# Patient Record
Sex: Female | Born: 1960 | Race: White | Hispanic: No | Marital: Married | Smoking: Current every day smoker
Health system: Southern US, Community
[De-identification: ages and names within clinical notes are randomized; demographics above are authoritative.]

## PROBLEM LIST (undated history)

## (undated) DIAGNOSIS — F329 Major depressive disorder, single episode, unspecified: Secondary | ICD-10-CM

## (undated) DIAGNOSIS — E785 Hyperlipidemia, unspecified: Secondary | ICD-10-CM

## (undated) DIAGNOSIS — F419 Anxiety disorder, unspecified: Secondary | ICD-10-CM

## (undated) DIAGNOSIS — T7840XA Allergy, unspecified, initial encounter: Secondary | ICD-10-CM

## (undated) DIAGNOSIS — F32A Depression, unspecified: Secondary | ICD-10-CM

## (undated) DIAGNOSIS — I10 Essential (primary) hypertension: Secondary | ICD-10-CM

## (undated) HISTORY — DX: Essential (primary) hypertension: I10

## (undated) HISTORY — DX: Anxiety disorder, unspecified: F41.9

## (undated) HISTORY — DX: Depression, unspecified: F32.A

## (undated) HISTORY — DX: Major depressive disorder, single episode, unspecified: F32.9

## (undated) HISTORY — DX: Allergy, unspecified, initial encounter: T78.40XA

## (undated) HISTORY — DX: Hyperlipidemia, unspecified: E78.5

---

## 2014-08-23 ENCOUNTER — Encounter: Payer: Self-pay | Admitting: *Deleted

## 2014-10-15 ENCOUNTER — Encounter: Payer: Self-pay | Admitting: Family Medicine

## 2014-10-15 ENCOUNTER — Ambulatory Visit (INDEPENDENT_AMBULATORY_CARE_PROVIDER_SITE_OTHER): Payer: BLUE CROSS/BLUE SHIELD | Admitting: Family Medicine

## 2014-10-15 VITALS — BP 118/74 | HR 94 | Temp 98.6°F | Resp 18 | Ht 68.5 in | Wt 256.0 lb

## 2014-10-15 DIAGNOSIS — F329 Major depressive disorder, single episode, unspecified: Secondary | ICD-10-CM

## 2014-10-15 DIAGNOSIS — D485 Neoplasm of uncertain behavior of skin: Secondary | ICD-10-CM

## 2014-10-15 DIAGNOSIS — F32A Depression, unspecified: Secondary | ICD-10-CM

## 2014-10-15 MED ORDER — METOPROLOL SUCCINATE ER 100 MG PO TB24: 100.0000 mg | ORAL_TABLET | Freq: Every day | ORAL | Status: DC

## 2014-10-15 MED ORDER — BUPROPION HCL ER (XL) 150 MG PO TB24: 300.0000 mg | ORAL_TABLET | Freq: Every day | ORAL | Status: DC

## 2014-10-15 MED ORDER — DULOXETINE HCL 60 MG PO CPEP: 60.0000 mg | ORAL_CAPSULE | Freq: Every day | ORAL | Status: DC

## 2014-10-15 NOTE — Progress Notes (Signed)
Subjective:    Patient ID: Megan Higgins, female    DOB: [birth year], 54 y.o.   MRN: [id number]  HPI (Date to establish care. She has a spot on her left breast that has been there since November. On examination there is a 4 cm x 4 cm large telangiectasia on her left breast at approximately 5:00 below the nipple. There is also a 1 cm x 1 cm ulcer in the center of this is very superficial. There is no palpable mass within the breast. There is no drainage expressed from the central ulcer. Patient stated it began in November after her dog stepped on her chest. She is also having worsening depression and anxiety. She complains of anxiety on a daily basis. She is also complaining of depression and anhedonia trouble sleeping poor concentration and weight gain. She denies suicidal ideation but she extremely tearful today on exam. She also complains of memory loss which she associates with the Crestor that she recently began. She also complains of myalgias on Crestor. Past Medical History  Diagnosis Date  . Allergy   . Hypertension   . Hyperlipidemia   . Depression   . Anxiety    No past surgical history on file. Current Outpatient Prescriptions on File Prior to Visit  Medication Sig Dispense Refill  . rosuvastatin (CRESTOR) 10 MG tablet Take 10 mg by mouth daily.     No current facility-administered medications on file prior to visit.   Allergies  Allergen Reactions  . Penicillins    History   Social History  . Marital Status: Married    Spouse Name: N/A    Number of Children: N/A  . Years of Education: N/A   Occupational History  . Not on file.   Social History Main Topics  . Smoking status: Current Every Day Smoker -- 1.00 packs/day    Types: Cigarettes  . Smokeless tobacco: Never Used  . Alcohol Use: No  . Drug Use: No  . Sexual Activity: Yes   Other Topics Concern  . Not on file   Social History Narrative   No family history on file.    Review of Systems  All  other systems reviewed and are negative.      Objective:   Physical Exam  Cardiovascular: Normal rate, regular rhythm, normal heart sounds and intact distal pulses.   No murmur heard. Pulmonary/Chest: Effort normal and breath sounds normal. No respiratory distress. She has no wheezes. She has no rales. She exhibits no tenderness.  Abdominal: Soft. Bowel sounds are normal. She exhibits no distension. There is no tenderness. There is no rebound and no guarding.  Skin: There is erythema.  Vitals reviewed.  please see the description of the lesion in the history present illness        Assessment & Plan:  Neoplasm of uncertain behavior of skin - Plan: Pathology  Depression  I'm concerned about possible Paget's disease of the breast. Using sterile technique, I anesthetized the lesion was 0.1% lidocaine with epinephrine. I then performed a shave biopsy of the central ulcer approximately 1 cm x 1 cm. This was sent to pathology and labeled container. Hemostasis was achieved with Drysol and a Band-Aid and pressure. I will also schedule the patient for mammogram. I will supplement and augment her Cymbalta with Wellbutrin XL 150 mg by mouth every morning. We will increase to 300 mg by mouth every morning in one month if no better. I would like to see the patient back in 3  months to check fasting lab work. In the meantime the patient will temporarily discontinue Crestor to see if the memory loss improves.

## 2014-10-17 ENCOUNTER — Telehealth: Payer: Self-pay | Admitting: *Deleted

## 2014-10-17 LAB — PATHOLOGY

## 2014-10-17 NOTE — Telephone Encounter (Signed)
Pt has appointment at [facility] on Feb 16 she is to arrive at 10:30 for a 10:45am appt, I left message for pt to return my call for appt date time and location, pending call back

## 2014-10-19 NOTE — Telephone Encounter (Signed)
Pt called back and is aware of her Mammogram appointment

## 2014-10-30 ENCOUNTER — Ambulatory Visit (HOSPITAL_COMMUNITY)
Admission: RE | Admit: 2014-10-30 | Discharge: 2014-10-30 | Disposition: A | Discharge status: Home / Self Care | Payer: BLUE CROSS/BLUE SHIELD | Source: Ambulatory Visit | Attending: Family Medicine | Admitting: Family Medicine

## 2014-10-30 ENCOUNTER — Other Ambulatory Visit: Payer: Self-pay | Admitting: Family Medicine

## 2014-10-30 DIAGNOSIS — D499 Neoplasm of unspecified behavior of unspecified site: Secondary | ICD-10-CM

## 2014-10-30 DIAGNOSIS — D485 Neoplasm of uncertain behavior of skin: Secondary | ICD-10-CM | POA: Insufficient documentation

## 2014-10-31 ENCOUNTER — Telehealth: Payer: Self-pay | Admitting: Family Medicine

## 2014-10-31 MED ORDER — CEPHALEXIN 500 MG PO CAPS: 500.0000 mg | ORAL_CAPSULE | Freq: Three times a day (TID) | ORAL | Status: DC

## 2014-10-31 NOTE — Telephone Encounter (Signed)
Pt aware, med sent to pharm and appt made for a 1 week follow up after antibx

## 2014-10-31 NOTE — Telephone Encounter (Signed)
Patient is calling to say that she went to get mammogram and they told her she needed an antibiotic for a biopsy she is going to have  [phone]

## 2014-10-31 NOTE — Telephone Encounter (Signed)
LMTRC

## 2014-10-31 NOTE — Telephone Encounter (Signed)
Per Dr. Dennard Schaumann pt should be on Keflex 500mg  TID x 7 days and follow up in 1 week after antibx

## 2014-11-15 ENCOUNTER — Encounter: Payer: Self-pay | Admitting: Family Medicine

## 2014-11-15 ENCOUNTER — Ambulatory Visit (INDEPENDENT_AMBULATORY_CARE_PROVIDER_SITE_OTHER): Payer: BLUE CROSS/BLUE SHIELD | Admitting: Family Medicine

## 2014-11-15 VITALS — BP 146/90 | HR 92 | Temp 98.1°F | Resp 18 | Ht 68.5 in | Wt 257.0 lb

## 2014-11-15 DIAGNOSIS — D485 Neoplasm of uncertain behavior of skin: Secondary | ICD-10-CM

## 2014-11-15 NOTE — Progress Notes (Signed)
Subjective:    Patient ID: Megan Higgins, female    DOB: 1960-12-03, 54 y.o.   MRN: [id number]  HPI 10/15/14 Here today to establish care. She has a spot on her left breast that has been there since November. On examination there is a 4 cm x 4 cm large telangiectasia on her left breast at approximately 5:00 below the nipple. There is also a 1 cm x 1 cm ulcer in the center of this is very superficial. There is no palpable mass within the breast. There is no drainage expressed from the central ulcer. Patient stated it began in November after her dog stepped on her chest. She is also having worsening depression and anxiety. She complains of anxiety on a daily basis. She is also complaining of depression and anhedonia trouble sleeping poor concentration and weight gain. She denies suicidal ideation but she extremely tearful today on exam. She also complains of memory loss which she associates with the Crestor that she recently began. She also complains of myalgias on Crestor.  At that time, my plan was:  I'm concerned about possible Paget's disease of the breast. Using sterile technique, I anesthetized the lesion was 0.1% lidocaine with epinephrine. I then performed a shave biopsy of the central ulcer approximately 1 cm x 1 cm. This was sent to pathology and labeled container. Hemostasis was achieved with Drysol and a Band-Aid and pressure. I will also schedule the patient for mammogram. I will supplement and augment her Cymbalta with Wellbutrin XL 150 mg by mouth every morning. We will increase to 300 mg by mouth every morning in one month if no better. I would like to see the patient back in 3 months to check fasting lab work. In the meantime the patient will temporarily discontinue Crestor to see if the memory loss improves.  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  Mammogram and [place] of  breast were negative.  We have tried keflex for possible cellulitis in the interval after the radiologist called and reported possible cellulitis, she is here for recheck today.  The lesion does not look appreciably different. There is an area of erythema and telangiectasia in the exact same spot on the left breast which is 4 cm x 3.5 cm. There is a 1 cm x 1 cm central ulcer which is very superficial. This is essentially unchanged from her previous exam. It does not seem to be improved with time or antibiotics. It also does not appear to be a pyogenic granuloma on my exam. There is no papular area of increased blood vessel growth.  Patient feels like she is doing better since we added Wellbutrin with regards to the depression.  Although not completely better she does not want to increase it at the present time. She would also like to defer her fasting blood work until May  Past Medical History  Diagnosis Date  . Allergy   . Hypertension   . Hyperlipidemia   . Depression   . Anxiety    No past surgical history on file. Current Outpatient Prescriptions on File Prior to Visit  Medication Sig Dispense Refill  . buPROPion (WELLBUTRIN XL) 150 MG 24 hr tablet Take 2 tablets (300 mg total) by mouth daily. 60 tablet 3  . cephALEXin (KEFLEX) 500 MG capsule Take 1 capsule (500 mg total) by mouth 3 (three) times daily. X 7 days 21 capsule 0  . cetirizine (  ZYRTEC) 10 MG tablet Take 10 mg by mouth daily.    . DULoxetine (CYMBALTA) 60 MG capsule Take 1 capsule (60 mg total) by mouth daily. 30 capsule 5  . metoprolol succinate (TOPROL-XL) 100 MG 24 hr tablet Take 1 tablet (100 mg total) by mouth daily. Take with or immediately following a meal. 30 tablet 5  . rosuvastatin (CRESTOR) 10 MG tablet Take 10 mg by mouth daily.     No current facility-administered medications on file prior to visit.   Allergies  Allergen Reactions  . Penicillins    History   Social History  . Marital Status: Married    Spouse  Name: N/A  . Number of Children: N/A  . Years of Education: N/A   Occupational History  . Not on file.   Social History Main Topics  . Smoking status: Current Every Day Smoker -- 1.00 packs/day    Types: Cigarettes  . Smokeless tobacco: Never Used  . Alcohol Use: No  . Drug Use: No  . Sexual Activity: Yes   Other Topics Concern  . Not on file   Social History Narrative   No family history on file.    Review of Systems  All other systems reviewed and are negative.      Objective:   Physical Exam  Cardiovascular: Normal rate, regular rhythm, normal heart sounds and intact distal pulses.   No murmur heard. Pulmonary/Chest: Effort normal and breath sounds normal. No respiratory distress. She has no wheezes. She has no rales. She exhibits no tenderness.  Abdominal: Soft. Bowel sounds are normal. She exhibits no distension. There is no tenderness. There is no rebound and no guarding.  Skin: There is erythema.  Vitals reviewed.  please see the description of the lesion in the history present illness        Assessment & Plan:  Neoplasm of uncertain behavior of skin - Plan: Ambulatory referral to Dermatology  breast cancer has been ruled out. The lesion doesn't bleed excessively and is extremely tender to palpation. However my clinical impression is that it is not a pyogenic granuloma. I do feel it is a nonhealing healing wound. I would like to get a second opinion from dermatology. It may require complete excisional biopsy but I would like to obtain a second opinion first. I've also asked the patient to return fasting in May for a CMP, fasting lipid panel, and a CBC. We can increase Wellbutrin at that time if she does not continue to improve. I will also recheck her blood pressure at that time and if still elevated, would add Diovan.

## 2015-03-08 ENCOUNTER — Other Ambulatory Visit: Payer: Self-pay | Admitting: Family Medicine

## 2015-04-19 ENCOUNTER — Other Ambulatory Visit: Payer: Self-pay | Admitting: Family Medicine

## 2015-04-20 ENCOUNTER — Other Ambulatory Visit: Payer: Self-pay | Admitting: Family Medicine

## 2015-04-22 NOTE — Telephone Encounter (Signed)
Refill appropriate and filled per protocol. 

## 2015-10-05 ENCOUNTER — Other Ambulatory Visit: Payer: Self-pay | Admitting: Family Medicine

## 2015-12-03 ENCOUNTER — Telehealth: Payer: Self-pay | Admitting: Family Medicine

## 2015-12-03 NOTE — Telephone Encounter (Signed)
Pt called and states that Megan Higgins told her that Dr. Dennard Higgins flagged her prescription request for Bupropion XL 150mg  and she needs an OV.  She is very confused and would like an explanation. [phone]

## 2015-12-04 NOTE — Telephone Encounter (Signed)
Called and spoke to pt and explained to her that we have not sent any of her medications to mail order that the last placed we sent her meds to was [facility]. She is having some issues with her ins and where to send for refills. She is going to call them back and have them refax an ok for her prescriptions. Told pt to tell them to put my name on it so that it will get put in my hands as I am afraid that [facility] is possibly sending to the wrong MD office.

## 2015-12-06 MED ORDER — BUPROPION HCL ER (XL) 150 MG PO TB24: 300.0000 mg | ORAL_TABLET | Freq: Every day | ORAL | Status: DC

## 2015-12-06 MED ORDER — METOPROLOL SUCCINATE ER 100 MG PO TB24: ORAL_TABLET | ORAL | Status: DC

## 2015-12-06 MED ORDER — DULOXETINE HCL 60 MG PO CPEP: ORAL_CAPSULE | ORAL | Status: DC

## 2015-12-06 NOTE — Telephone Encounter (Signed)
[facility] faxes from [facility] and meds refilled

## 2016-04-15 IMAGING — MG MM DIGITAL DIAGNOSTIC BILAT
5 series · 5 of 5 positions shown · non-contrast
Comparison: None.

CLINICAL DATA: 54-year-old female with a nonhealing painful left
breast wound. The patient initially had an erythematous area of
concern several months ago after her breast had been stepped on by
her dog. The patient went to see her primary physician who
surgically excised a small piece of area with pathology returning
benign (pyogenic granuloma), however the patient now reports a
nonhealing in painful wound in this location.

EXAM:
DIGITAL DIAGNOSTIC BILATERAL MAMMOGRAM WITH CAD
ULTRASOUND LEFT BREAST

[L CC]
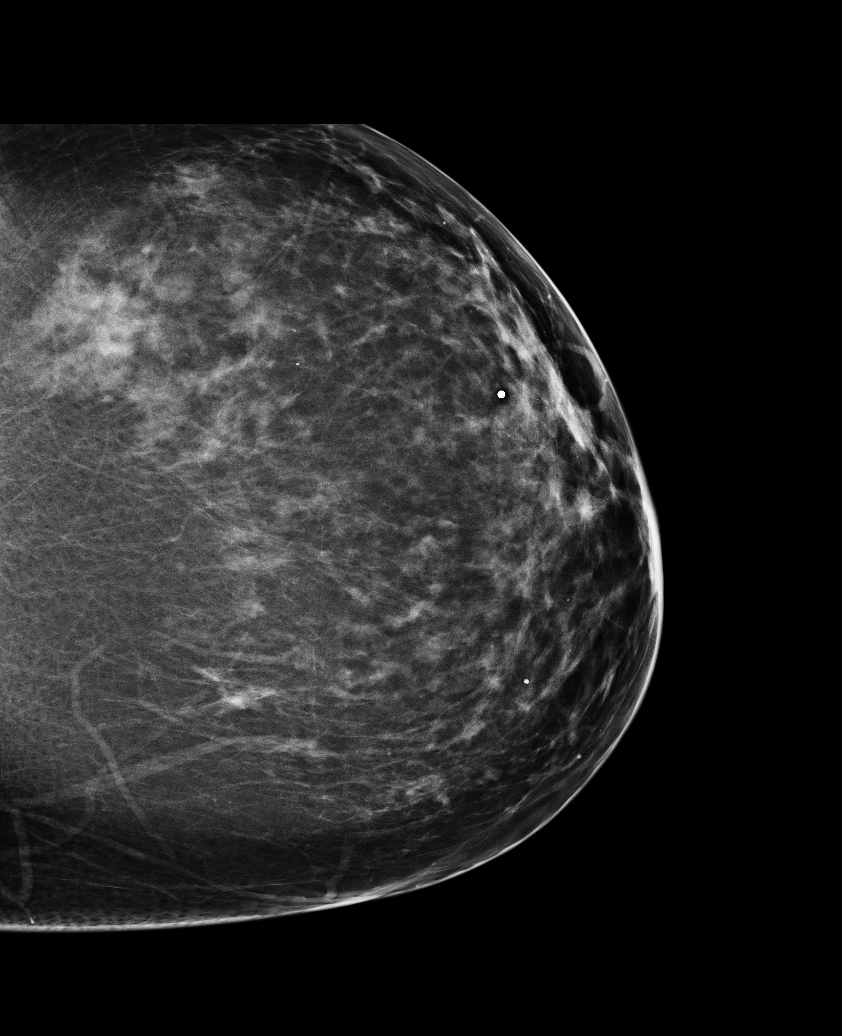

[L MLO]
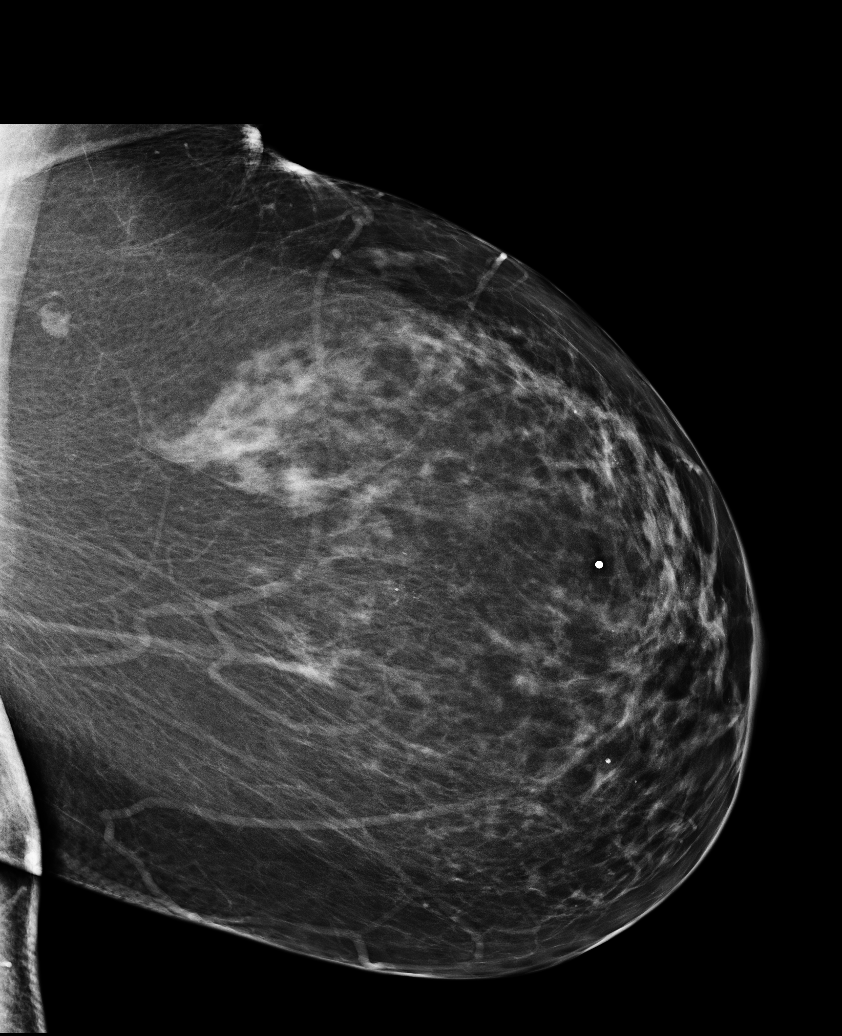

[R CC]
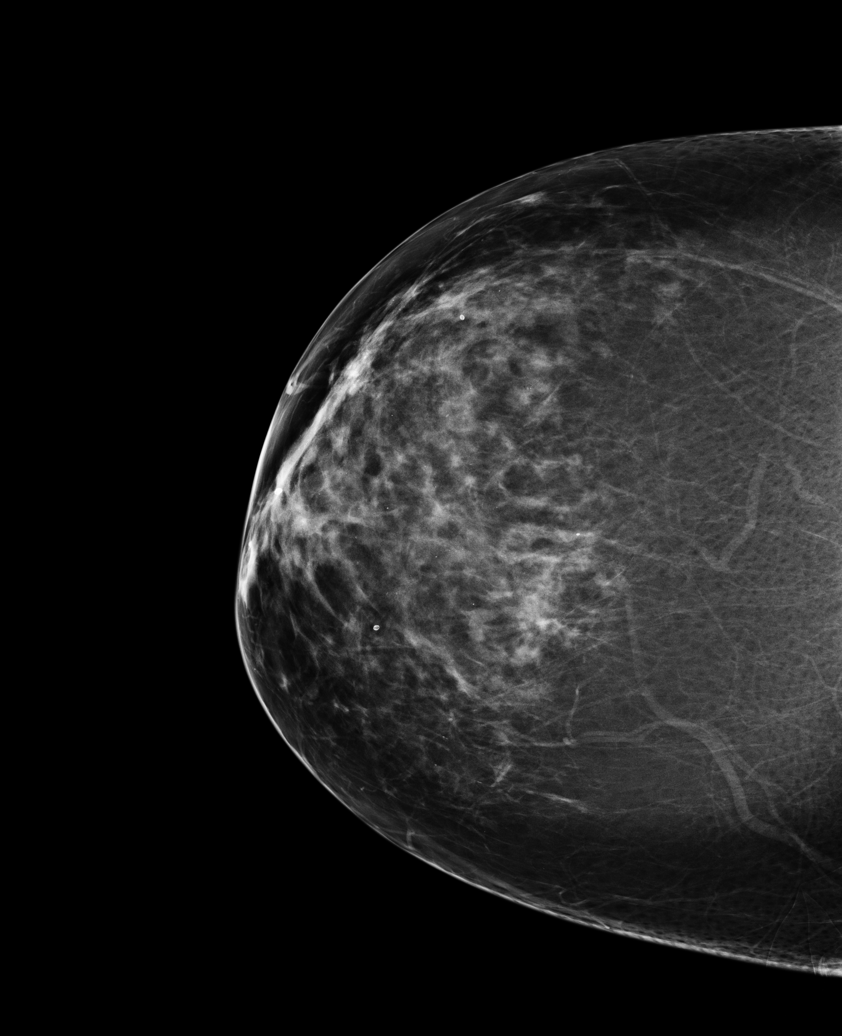

[R MLO]
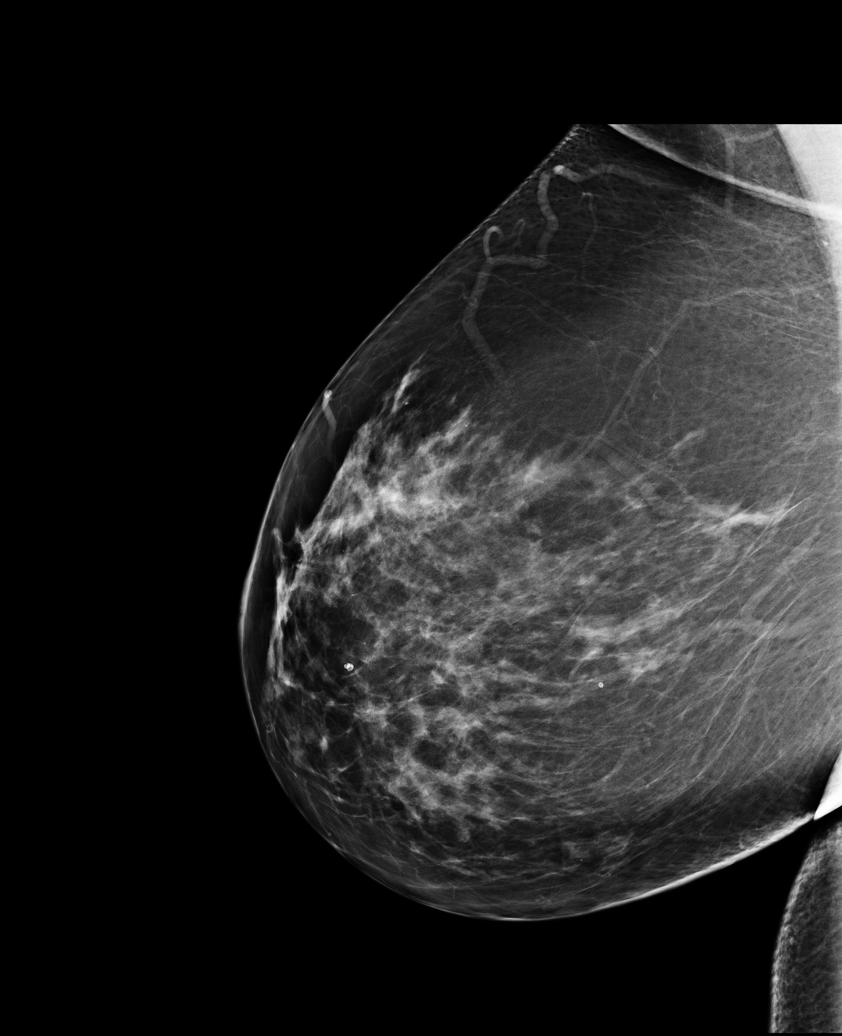

[L TAN]
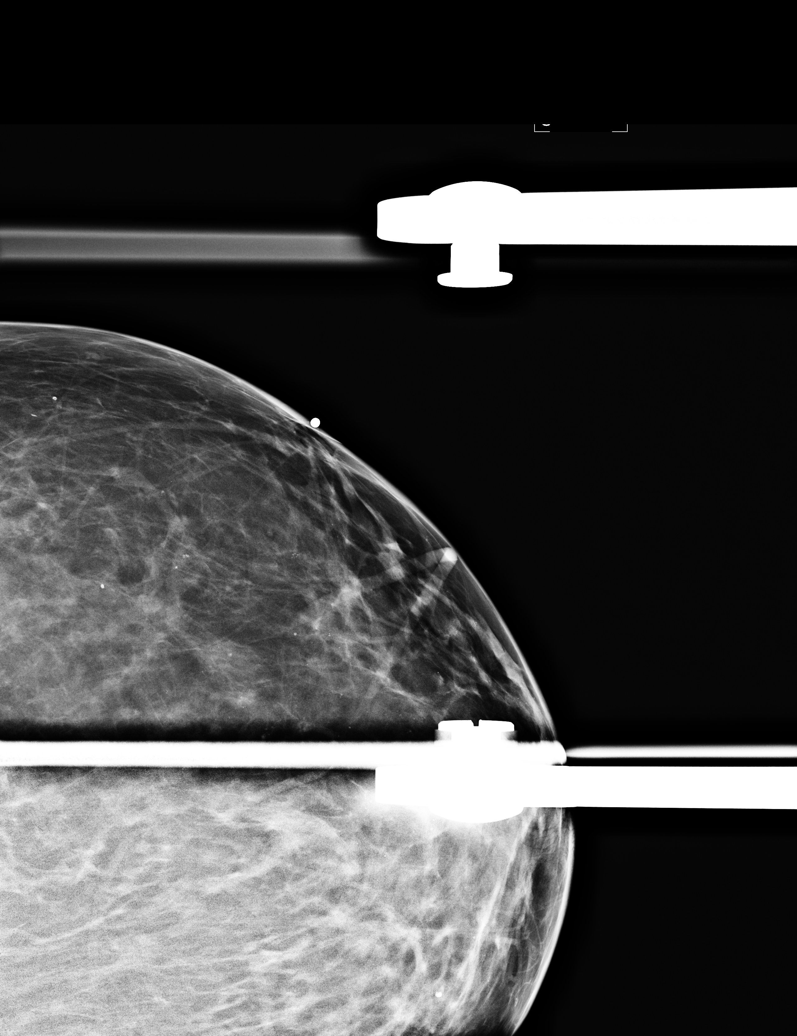

[5 of 5 positions shown; findings below may reference images not displayed]

ACR Breast Density Category c: The breast tissue is heterogeneously
dense, which may obscure small masses.
FINDINGS: No suspicious masses or calcifications are seen in either breast. A
spot compression tangential view was performed over the area of
concern in the left breast (area of concern involving nonhealing
skin wound), with no mammographic abnormalities seen in this
location.

Mammographic images were processed with CAD.

Physical examination of the patient's left breast reveals a scabbed
over wound measuring approximately 1 to 1-1/2 cm with a very small
amount of serous drainage. No purulence expressed. There is
surrounding tenderness and erythema. Targeted ultrasound in the
region of the wound in the inferior left breast does not reveal any
suspicious abnormalities or masses. No findings to suggest abscess.
IMPRESSION: 1. No mammographic evidence of malignancy in either breast.

2. Small skin wound involving the inferior left breast with small
amount of serous drainage. There is surrounding erythema but no
underlying masses or fluid collections to suggest abscess.

RECOMMENDATION:
1. Spoke with the patient's physician Dr. Junita 10/30/2014 at
[DATE] a.m. regarding the possibility of starting a course of
antibiotics to see if this helps with wound healing given the
erythema and small amount of wound drainage. If this wound does not
demonstrate appropriate healing, consider referral to dermatologist
or possible surgeon.

2.  Screening mammogram in one year.(Code:UR-1-YDB)

I have discussed the findings and recommendations with the patient.
Results were also provided in writing at the conclusion of the
visit. If applicable, a reminder letter will be sent to the patient
regarding the next appointment.

BI-RADS CATEGORY  2: Benign.

## 2016-09-17 ENCOUNTER — Other Ambulatory Visit: Payer: Self-pay | Admitting: *Deleted

## 2016-09-17 MED ORDER — BUPROPION HCL ER (XL) 150 MG PO TB24: 300.0000 mg | ORAL_TABLET | Freq: Every day | ORAL | 3 refills | Status: DC

## 2016-09-17 NOTE — Telephone Encounter (Signed)
Received fax requesting refill on Bupropion.   Refill appropriate and filled per protocol.

## 2016-11-12 ENCOUNTER — Other Ambulatory Visit: Payer: Self-pay | Admitting: Family Medicine

## 2016-12-09 ENCOUNTER — Other Ambulatory Visit: Payer: Self-pay | Admitting: Family Medicine

## 2016-12-09 MED ORDER — DULOXETINE HCL 60 MG PO CPEP: 60.0000 mg | ORAL_CAPSULE | Freq: Every day | ORAL | 3 refills | Status: DC

## 2016-12-09 MED ORDER — METOPROLOL SUCCINATE ER 100 MG PO TB24: ORAL_TABLET | ORAL | 3 refills | Status: DC

## 2017-10-07 ENCOUNTER — Encounter: Payer: Self-pay | Admitting: Family Medicine

## 2017-10-07 NOTE — Telephone Encounter (Signed)
This encounter was created in error - please disregard.

## 2017-10-11 ENCOUNTER — Telehealth: Payer: Self-pay | Admitting: Family Medicine

## 2017-10-11 MED ORDER — METOPROLOL SUCCINATE ER 100 MG PO TB24: ORAL_TABLET | ORAL | 0 refills | Status: AC

## 2017-10-11 MED ORDER — BUPROPION HCL ER (XL) 150 MG PO TB24: 300.0000 mg | ORAL_TABLET | Freq: Every day | ORAL | 0 refills | Status: AC

## 2017-10-11 MED ORDER — DULOXETINE HCL 60 MG PO CPEP: 60.0000 mg | ORAL_CAPSULE | Freq: Every day | ORAL | 0 refills | Status: AC

## 2017-10-11 MED ORDER — ROSUVASTATIN CALCIUM 10 MG PO TABS: 10.0000 mg | ORAL_TABLET | Freq: Every day | ORAL | 0 refills | Status: AC

## 2017-10-11 NOTE — Telephone Encounter (Signed)
Pt called and states that they had to abruptly move to [place] for her husbands work and she is needing a refill on her medications. Informed her that I would send in a 1 month supply since it has been well over a year since we have seen her and that she really needs to find a PCP in [place]. She agreed and med sent to pharm.

## 2017-12-06 ENCOUNTER — Other Ambulatory Visit: Payer: Self-pay | Admitting: Family Medicine
